# Patient Record
Sex: Male | Born: 2000 | Race: White | Hispanic: No | Marital: Single | Smoking: Never smoker
Health system: Southern US, Community
[De-identification: ages and names within clinical notes are randomized; demographics above are authoritative.]

---
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
## 2019-01-02 IMAGING — CR DG SHOULDER 2+V*L*
3 series · 3 of 3 positions shown · non-contrast
Comparison: None.

CLINICAL DATA: Left shoulder pain playing rugby

EXAM:
LEFT SHOULDER - 2+ VIEW

[w shoulder external left]
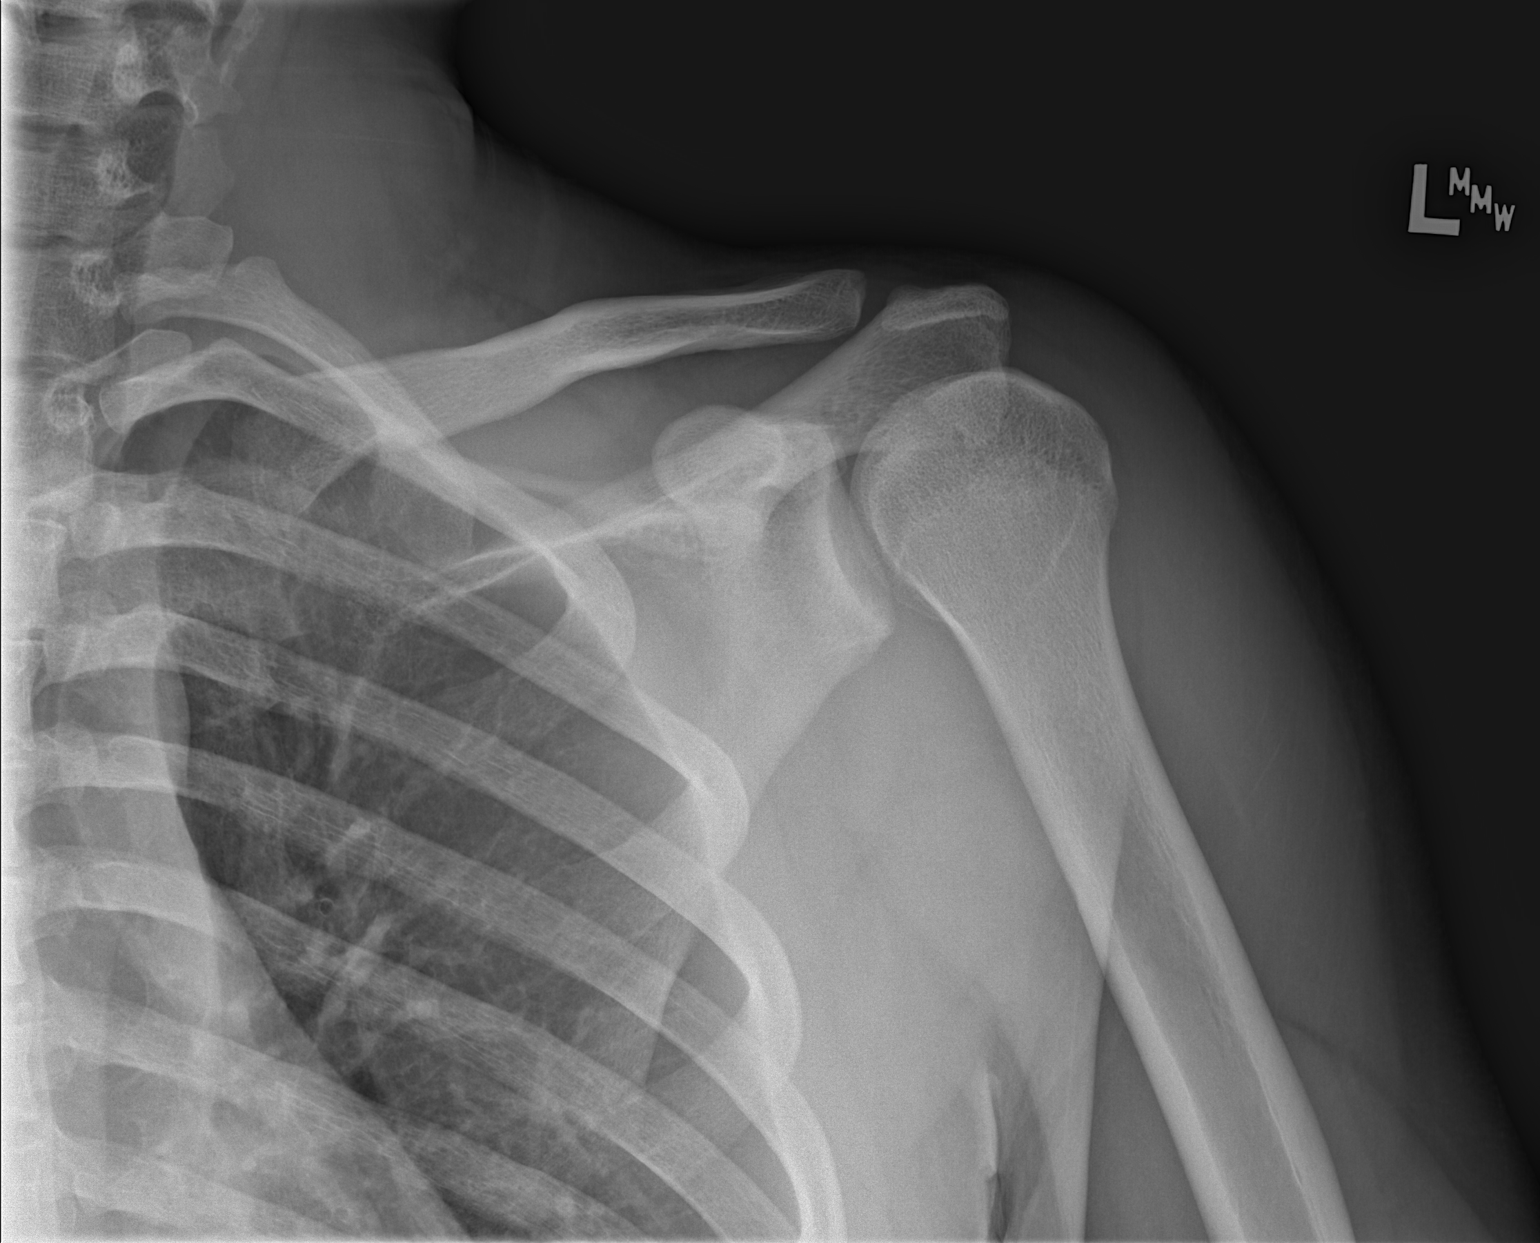

[w shoulder y-view left]
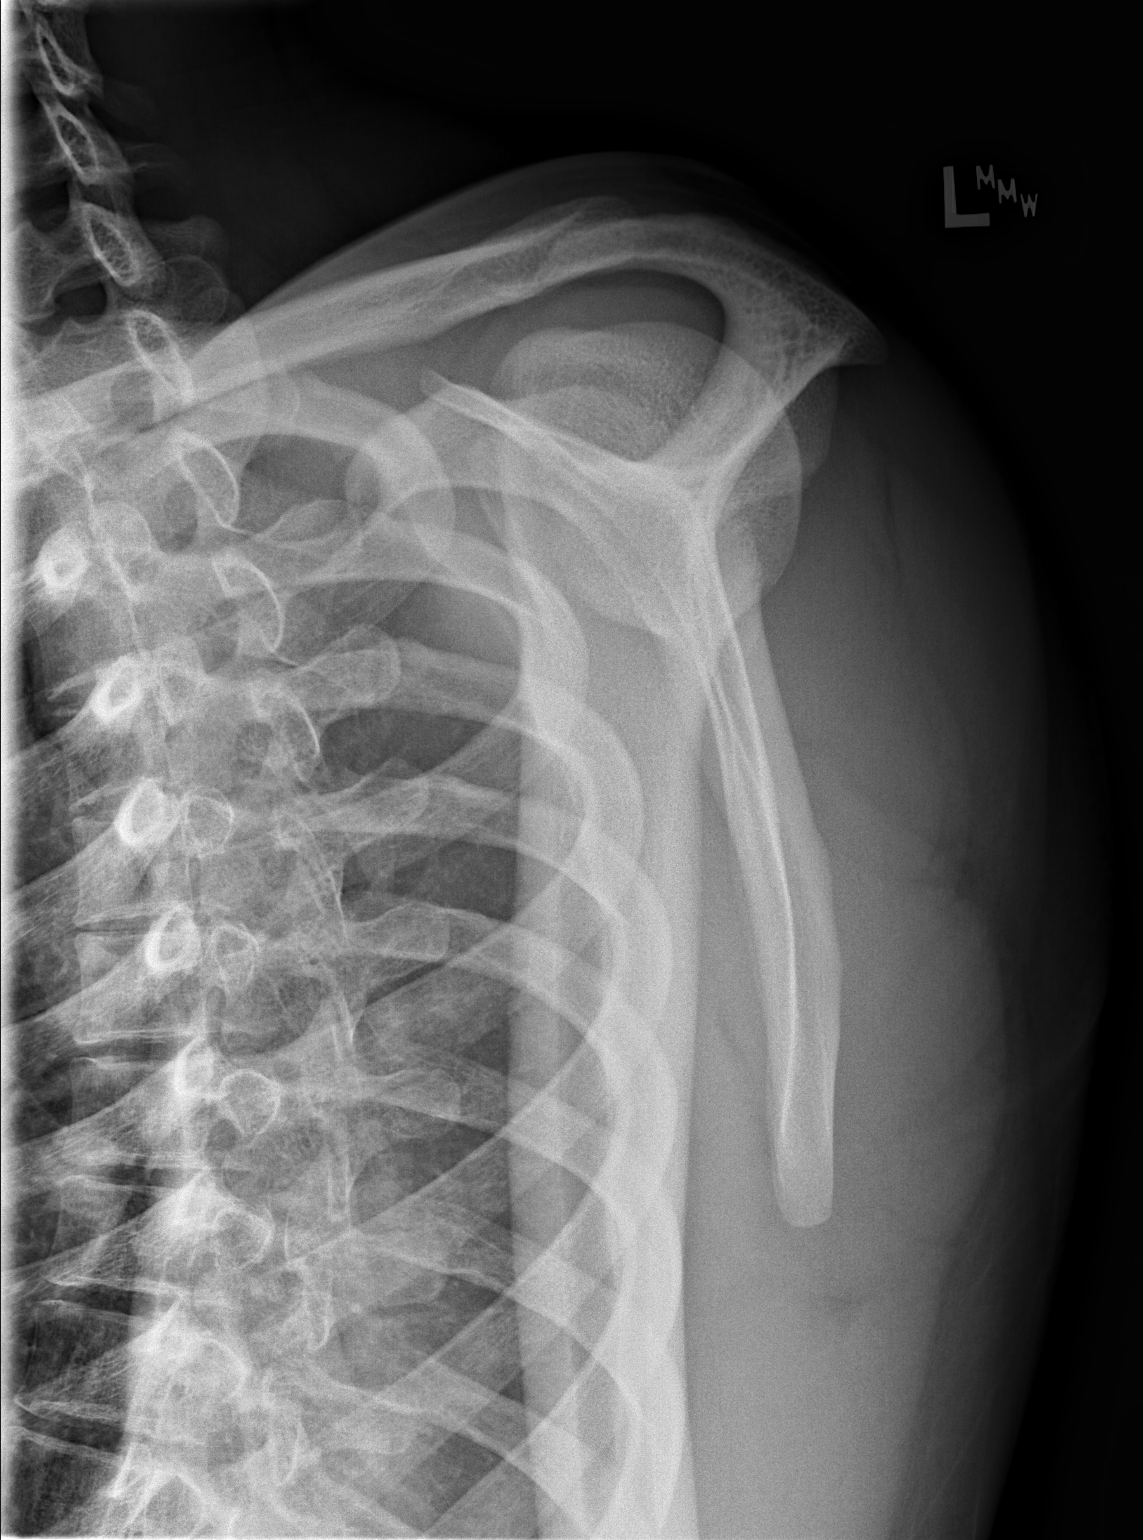

[x shoulder axillary left]
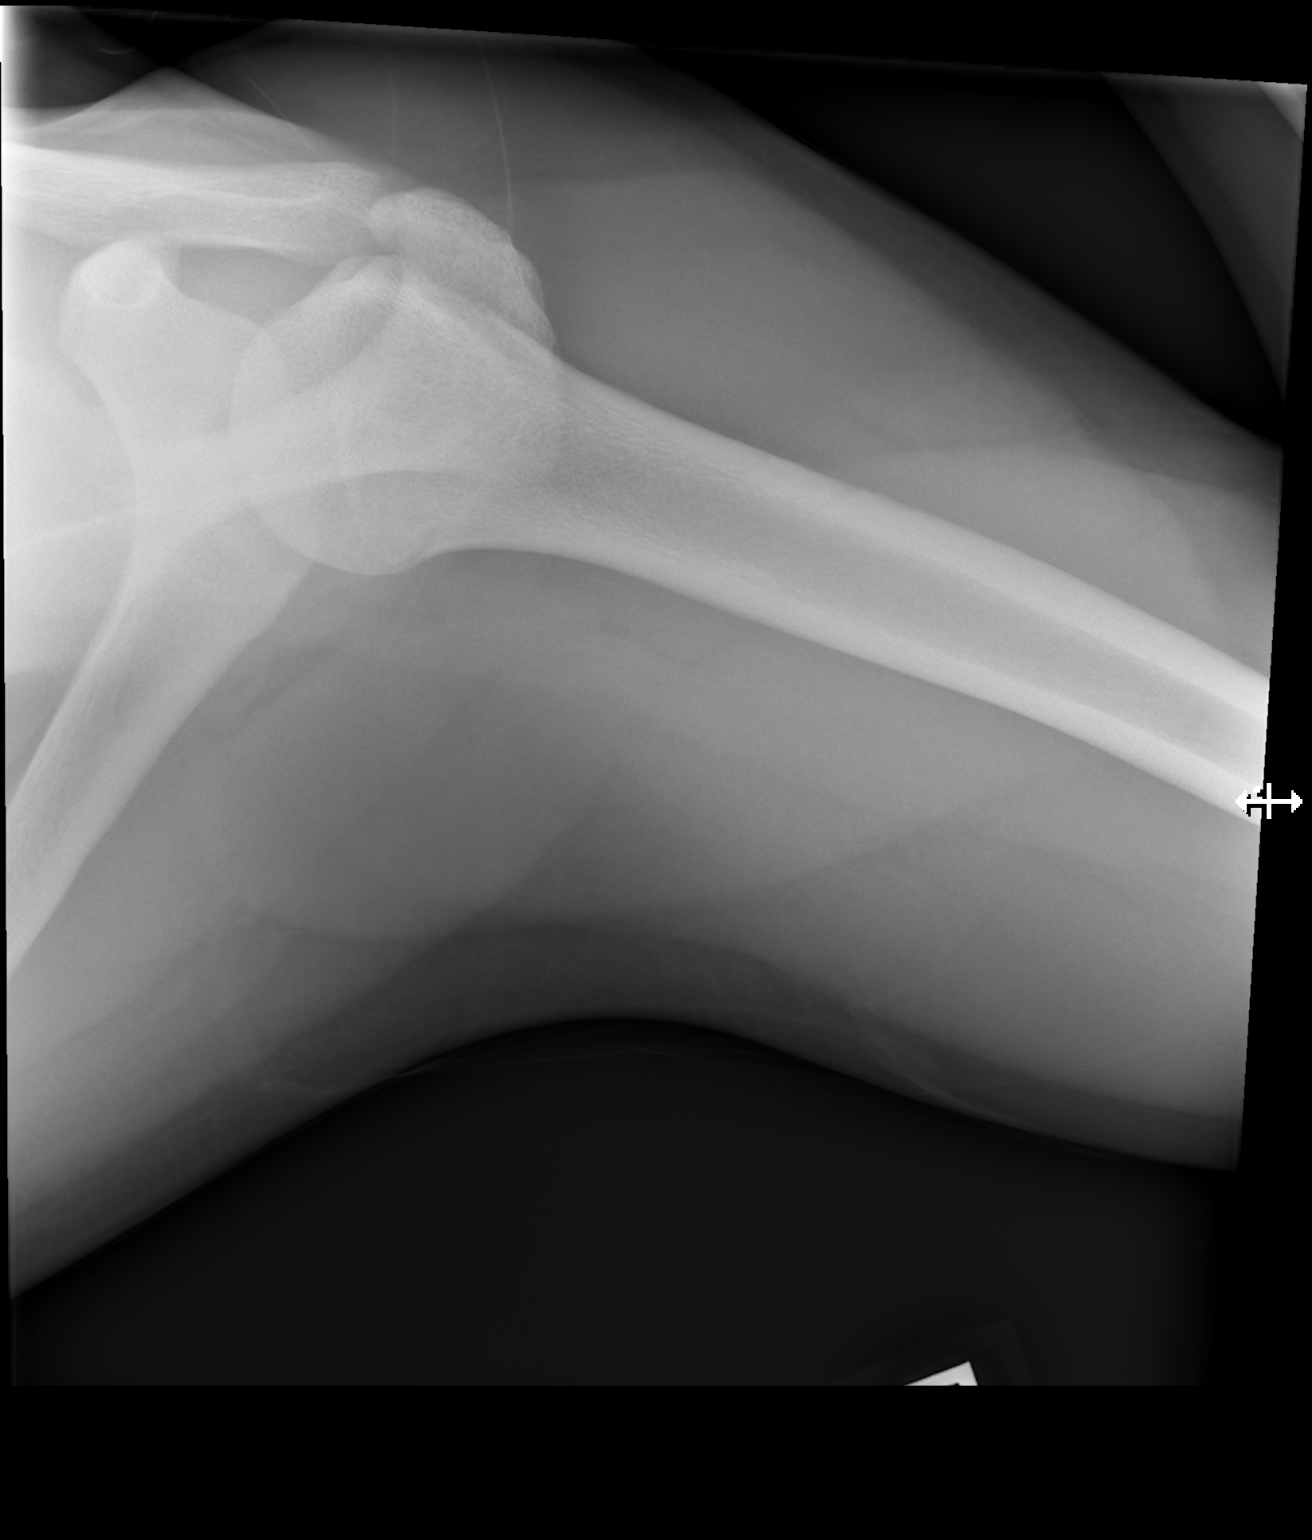

[3 of 3 positions shown; findings below may reference images not displayed]

FINDINGS: There is no evidence of fracture or dislocation. There is no
evidence of arthropathy or other focal bone abnormality. Soft
tissues are unremarkable.
IMPRESSION: No acute osseous injury of the left shoulder.
